# Patient Record
Sex: Male | Born: 1945 | Race: White | Hispanic: No | Marital: Married | Smoking: Former smoker
Health system: Southern US, Community
[De-identification: ages and names within clinical notes are randomized; demographics above are authoritative.]

## PROBLEM LIST (undated history)

## (undated) DIAGNOSIS — G4733 Obstructive sleep apnea (adult) (pediatric): Secondary | ICD-10-CM

## (undated) DIAGNOSIS — R011 Cardiac murmur, unspecified: Secondary | ICD-10-CM

## (undated) DIAGNOSIS — E7800 Pure hypercholesterolemia, unspecified: Secondary | ICD-10-CM

## (undated) DIAGNOSIS — K5790 Diverticulosis of intestine, part unspecified, without perforation or abscess without bleeding: Secondary | ICD-10-CM

## (undated) DIAGNOSIS — C61 Malignant neoplasm of prostate: Secondary | ICD-10-CM

## (undated) HISTORY — PX: TONSILLECTOMY: SUR1361

## (undated) HISTORY — PX: OTHER SURGICAL HISTORY: SHX169

---

## 2004-01-27 DIAGNOSIS — E7800 Pure hypercholesterolemia, unspecified: Secondary | ICD-10-CM | POA: Insufficient documentation

## 2005-02-05 ENCOUNTER — Ambulatory Visit: Payer: Self-pay | Admitting: Cardiology

## 2011-05-13 DIAGNOSIS — H9190 Unspecified hearing loss, unspecified ear: Secondary | ICD-10-CM | POA: Insufficient documentation

## 2011-05-13 DIAGNOSIS — H9319 Tinnitus, unspecified ear: Secondary | ICD-10-CM | POA: Insufficient documentation

## 2012-06-06 DIAGNOSIS — H6120 Impacted cerumen, unspecified ear: Secondary | ICD-10-CM | POA: Insufficient documentation

## 2014-11-25 DIAGNOSIS — Z79899 Other long term (current) drug therapy: Secondary | ICD-10-CM | POA: Insufficient documentation

## 2016-11-09 DIAGNOSIS — N528 Other male erectile dysfunction: Secondary | ICD-10-CM | POA: Insufficient documentation

## 2018-02-20 ENCOUNTER — Other Ambulatory Visit: Payer: Self-pay | Admitting: Urology

## 2018-02-20 DIAGNOSIS — C61 Malignant neoplasm of prostate: Secondary | ICD-10-CM

## 2018-03-01 DIAGNOSIS — J309 Allergic rhinitis, unspecified: Secondary | ICD-10-CM | POA: Insufficient documentation

## 2018-03-07 ENCOUNTER — Ambulatory Visit
Admission: RE | Admit: 2018-03-07 | Discharge: 2018-03-07 | Disposition: A | Discharge status: Home / Self Care | Payer: Medicare Other | Source: Ambulatory Visit | Attending: Urology | Admitting: Urology

## 2018-03-07 DIAGNOSIS — C61 Malignant neoplasm of prostate: Secondary | ICD-10-CM

## 2018-03-07 MED ORDER — GADOBENATE DIMEGLUMINE 529 MG/ML IV SOLN
15.0000 mL | Freq: Once | INTRAVENOUS | Status: AC | PRN
  Administered 2018-03-07: 15 mL via INTRAVENOUS

## 2018-04-24 ENCOUNTER — Telehealth: Payer: Self-pay | Admitting: Medical Oncology

## 2018-04-24 ENCOUNTER — Encounter: Payer: Self-pay | Admitting: Medical Oncology

## 2018-04-24 NOTE — Telephone Encounter (Signed)
I called pt to introduce myself as the Prostate Nurse Navigator and the Coordinator of the Prostate [place].  1. I confirmed with the patient he is aware of his referral to the clinic 04/28/18 arriving at 8:00 a.m  2. I discussed the format of the clinic and the physicians he will be seeing that day.  3. I discussed where the clinic is located and how to contact me.  4. I confirmed his address and informed him I would be mailing a packet of information and forms to be completed. I asked him to bring them with him the day of his appointment. He states that mail delivery is not the best where he lives and asked if I would e-mail forms. Forms were sent.  He voiced understanding of the above. I asked him to call me if he has any questions or concerns regarding his appointments or the forms he needs to complete.

## 2018-04-26 ENCOUNTER — Encounter: Payer: Self-pay | Admitting: Radiation Oncology

## 2018-04-26 NOTE — Progress Notes (Signed)
GU Location of Tumor / Histology: prostatic adenocarcinoma  If Prostate Cancer, Gleason Score is (4 + 3) and PSA is (10.8) on 01/2018. Prostate volume: 37.3 grams on 04/10/2018.   Adam Mendoza originally presented to Dr. Alinda Money in July 2015 for further evaluation of an elevated PSA> Prostate biopsy in 2011 by Roxanne Gates was benign despite PSA of 5.94. The patient underwent another biopsy in 2015 with Dr. Alinda Money for a PSA of 8.98 which was also benign.   07/27/2017 PSA 9.79 01/26/2017 PSA 9.51 01/06/2017 PSA 13.25  07/28/2016 PSA 9.21 06/21/2016 PSA 12.14 01/20/2016 PSA 9.56 11/17/2015 PSA 11.77 06/19/2015 PSA 8.40   Biopsies of prostate (if applicable) revealed:    Past/Anticipated interventions by urology, if any: biopsy, biopsy, referral to [facility]  Past/Anticipated interventions by medical oncology, if any: no  Weight changes, if any: no  Bowel/Bladder complaints, if any:    Nausea/Vomiting, if any: no  Pain issues, if any:  no  SAFETY ISSUES:  Prior radiation? no  Pacemaker/ICD? no  Possible current pregnancy? no  Is the patient on methotrexate? no  Current Complaints / other details:  73 year old male. Married. Former smoker.

## 2018-04-27 ENCOUNTER — Telehealth: Payer: Self-pay | Admitting: Medical Oncology

## 2018-04-27 DIAGNOSIS — C61 Malignant neoplasm of prostate: Secondary | ICD-10-CM | POA: Insufficient documentation

## 2018-04-27 NOTE — Telephone Encounter (Signed)
Spoke with patient to confirm [facility] appointment 04/28/18 arriving at 8:00 am. We reviewed [place] parking, registration and reminded him to bring his completed medical forms. He voiced understanding.

## 2018-04-28 ENCOUNTER — Inpatient Hospital Stay: Payer: Medicare Other | Attending: Oncology | Admitting: Oncology

## 2018-04-28 ENCOUNTER — Encounter: Payer: Self-pay | Admitting: Radiation Oncology

## 2018-04-28 ENCOUNTER — Other Ambulatory Visit: Payer: Self-pay

## 2018-04-28 ENCOUNTER — Encounter: Payer: Self-pay | Admitting: Medical Oncology

## 2018-04-28 ENCOUNTER — Encounter: Payer: Self-pay | Admitting: General Practice

## 2018-04-28 ENCOUNTER — Ambulatory Visit
Admission: RE | Admit: 2018-04-28 | Discharge: 2018-04-28 | Disposition: A | Discharge status: Home / Self Care | Payer: Medicare Other | Source: Ambulatory Visit | Attending: Radiation Oncology | Admitting: Radiation Oncology

## 2018-04-28 DIAGNOSIS — Z79899 Other long term (current) drug therapy: Secondary | ICD-10-CM | POA: Diagnosis not present

## 2018-04-28 DIAGNOSIS — N402 Nodular prostate without lower urinary tract symptoms: Secondary | ICD-10-CM | POA: Diagnosis not present

## 2018-04-28 DIAGNOSIS — C61 Malignant neoplasm of prostate: Secondary | ICD-10-CM

## 2018-04-28 DIAGNOSIS — G4733 Obstructive sleep apnea (adult) (pediatric): Secondary | ICD-10-CM | POA: Diagnosis not present

## 2018-04-28 DIAGNOSIS — E7800 Pure hypercholesterolemia, unspecified: Secondary | ICD-10-CM | POA: Diagnosis not present

## 2018-04-28 DIAGNOSIS — Z8546 Personal history of malignant neoplasm of prostate: Secondary | ICD-10-CM | POA: Diagnosis not present

## 2018-04-28 DIAGNOSIS — Z87891 Personal history of nicotine dependence: Secondary | ICD-10-CM

## 2018-04-28 DIAGNOSIS — Z923 Personal history of irradiation: Secondary | ICD-10-CM | POA: Insufficient documentation

## 2018-04-28 HISTORY — DX: Diverticulosis of intestine, part unspecified, without perforation or abscess without bleeding: K57.90

## 2018-04-28 HISTORY — DX: Pure hypercholesterolemia, unspecified: E78.00

## 2018-04-28 HISTORY — DX: Malignant neoplasm of prostate: C61

## 2018-04-28 HISTORY — DX: Obstructive sleep apnea (adult) (pediatric): G47.33

## 2018-04-28 NOTE — Consult Note (Signed)
Multi-Disciplinary Clinic     04/28/2018   --------------------------------------------------------------------------------   Adam Mendoza  MRN: [id number]  PRIMARY CARE:  Adam Mendoza  DOB: 01-23-1946, 73 year old Male  REFERRING:  Adam Mendoza  SSN: -[id number]  PROVIDER:  Raynelle Bring, M.D.    LOCATION:  [facility] [phone]   --------------------------------------------------------------------------------   CC/HPI: CC: Prostate Cancer   PCP: Dr. Eber Mendoza  Location of consult: [facility] - Prostate Cancer Multidisciplinary Clinic   Mr. Adam Mendoza is a 73 year old gentleman with a PMH significant for sleep apnea and hyperlipidemia who has a long standing history of an elevated PSA. He had undergone his initial prostate biopsy in 2011 when his PSA was 5.94 and this was benign. He established care with me in 2015 and his PSA had increased to 8.98 with a free percentage of 11% prompting another TRUS biopsy, which also was benign. His PSA further increased to 10.8 recently and an MRI of the prostate was performed on 03/07/18 that demonstrated two separate PI-RADS 3 lesions at the right mid gland and posterior apex. He underwent an MR/US fusion biopsy on 04/10/18 and the targeted lesions were benign but 3 of the systematic biopsies were positive and he was diagnosed with Gleason 4+3=7 adenocarcinoma with a total of 3 out of 20 biopsy cores positive.   Family history: None.   Imaging studies:  MRI (03/07/18): No EPE, SVI, or LAD.   PMH: He has a history of sleep apnea and hyperlipidemia.  PSH: No abdominal surgeries.   TNM stage: cT1c N0 Mx  PSA: 10.8  Gleason score: 4+3=7  Biopsy (04/10/18): 3/20 cores positive  Left: L lateral mid (10%, 4+3=7, PNI), L mid (5%, 3+4=7), L lateral base (20%, 4+3=7, PNI)  Right: Benign  Targeted biopsies: Benign  Prostate volume: 37.3 cc   Nomogram  OC disease: 49%  EPE: 49%  SVI: 2%  LNI: 3%  PFS (5  year, 10 year): 74%, 59%   Urinary function: IPSS is 3.  Erectile function: SHIM score is 18.     ALLERGIES: Sudafed TABS    MEDICATIONS: Amoxicillin  Codeine-Guaifenesin  Flonase 50 mcg/actuation spray, suspension Nasal  Glucosamine Chondroitin  Icaps  Multivitamins tablet Oral  Omega-3 Krill Oil  Simvastatin 20 mg tablet Oral     GU PSH: Prostate Needle Biopsy - 04/10/2018      PSH Notes: Shoulder Arthroscopy With Rotator Cuff Repair, Tonsillectomy   NON-GU PSH: Remove Tonsils - 2015 Rotator cuff surgery - 2015 Surgical Pathology, Gross And Microscopic Examination For Prostate Needle - 04/10/2018    GU PMH: Elevated PSA, Elevated prostate specific antigen (PSA) - 2017      PMH Notes:   1) Elevated PSA: He initially presented to me in July 2015 for an evaluation of an elevated PSA. He had undergone a 10 core biopsy in 2011 by Dr. Roxanne Gates in [place] for a PSA of 5.94 that was benign. His PSA continued to increase and he underwent another biopsy under my care in July 2015 for a PSA of 8.98 (11% free) that was also benign.   2011: 10 core biopsy by Dr. Lerry Liner - benign (PSA 5.94)  Jul 2015: 12 core biopsy (PSA 8.98) - benign, Vol 41.1 cc     NON-GU PMH: Diverticulosis of large intestine without perforation or abscess without bleeding, Diverticular disease of colon - 2015 Obstructive sleep apnea (adult) (pediatric), OSA (obstructive sleep apnea) - 2015 Hypercholesterolemia  FAMILY HISTORY: Alzheimer's Disease - Father, Mother Death of family member - Father, Mother   SOCIAL HISTORY: Marital Status: Married Preferred Language: English; Ethnicity: Not Hispanic Or Latino; Race: White Current Smoking Status: Patient does not smoke anymore. Has not smoked since 01/19/1990.  Drinks 3 drinks per day. Types of alcohol consumed: Liquor, Wine. Light Drinker.  Drinks 3 caffeinated drinks per day.     Notes: Alcohol use, Married, Former smoker   REVIEW OF SYSTEMS:    GU  Review Male:   Patient denies frequent urination, hard to postpone urination, burning/ pain with urination, get up at night to urinate, leakage of urine, stream starts and stops, trouble starting your streams, and have to strain to urinate .  Gastrointestinal (Upper):   Patient denies nausea and vomiting.  Gastrointestinal (Lower):   Patient denies diarrhea and constipation.  Constitutional:   Patient denies fever, night sweats, weight loss, and fatigue.  Skin:   Patient denies skin rash/ lesion and itching.  Eyes:   Patient denies blurred vision and double vision.  Ears/ Nose/ Throat:   Patient denies sore throat and sinus problems.  Hematologic/Lymphatic:   Patient denies swollen glands and easy bruising.  Cardiovascular:   Patient denies leg swelling and chest pains.  Respiratory:   Patient denies cough and shortness of breath.  Endocrine:   Patient denies excessive thirst.  Musculoskeletal:   Patient denies back pain and joint pain.  Neurological:   Patient denies headaches and dizziness.  Psychologic:   Patient denies anxiety and depression.   VITAL SIGNS: None   MULTI-SYSTEM PHYSICAL EXAMINATION:    Constitutional: Well-nourished. No physical deformities. Normally developed. Good grooming.  Neck: Neck symmetrical, not swollen. Normal tracheal position.  Respiratory: No labored breathing, no use of accessory muscles. Clear bilaterally.  Cardiovascular: Normal temperature, normal extremity pulses, no swelling, no varicosities. Regular rate and rhythm.  Lymphatic: No enlargement of neck, axillae, groin.  Skin: No paleness, no jaundice, no cyanosis. No lesion, no ulcer, no rash.  Neurologic / Psychiatric: Oriented to time, oriented to place, oriented to person. No depression, no anxiety, no agitation.  Gastrointestinal: No mass, no tenderness, no rigidity, non obese abdomen.   Eyes: Normal conjunctivae. Normal eyelids.  Ears, Nose, Mouth, and Throat: Left ear no scars, no lesions, no  masses. Right ear no scars, no lesions, no masses. Nose no scars, no lesions, no masses. Normal hearing. Normal lips.  Musculoskeletal: Normal gait and station of head and neck.     PAST DATA REVIEWED:  Source Of History:  Patient  Lab Test Review:   PSA  Records Review:   Pathology Reports, Previous Patient Records   02/15/18 07/27/17 01/26/17 01/06/17 07/28/16 06/21/16 01/20/16 11/17/15  PSA  Total PSA 10.80 ng/mL 9.79 ng/mL 9.51 ng/mL 13.25 ng/dl 9.21 ng/dl 12.14 ng/dl 9.56  11.77 ng/dl  Free PSA  0.79 ng/mL 0.79 ng/mL  0.60 ng/dl  0.63    % Free PSA  8 % PSA 8 % PSA  7 %  7      PROCEDURES: None   ASSESSMENT:      ICD-10 Details  1 GU:   Prostate Cancer - C61    PLAN:           Document Letter(s):  Created for Patient: Clinical Summary         Notes:   1. Prostate cancer: I had a detailed discussion with Mr. Hoback and his wife today regarding his management/treatment options for prostate cancer. The patient was  counseled about the natural history of prostate cancer and the standard treatment options that are available for prostate cancer. It was explained to him how his age and life expectancy, clinical stage, Gleason score, and PSA affect his prognosis, the decision to proceed with additional staging studies, as well as how that information influences recommended treatment strategies. We discussed the roles for active surveillance, radiation therapy, surgical therapy, androgen deprivation, as well as ablative therapy options for the treatment of prostate cancer as appropriate to his individual cancer situation. We discussed the risks and benefits of these options with regard to their impact on cancer control and also in terms of potential adverse events, complications, and impact on quality of life particularly related to urinary and sexual function. The patient was encouraged to ask questions throughout the discussion today and all questions were answered to his stated  satisfaction. In addition, the patient was provided with and/or directed to appropriate resources and literature for further education about prostate cancer and treatment options. We discussed surgical therapy for prostate cancer including the different available surgical approaches. We discussed, in detail, the risks and expectations of surgery with regard to cancer control, urinary control, and erectile function as well as the expected postoperative recovery process. Additional risks of surgery including but not limited to bleeding, infection, hernia formation, nerve damage, lymphocele formation, bowel/rectal injury potentially necessitating colostomy, damage to the urinary tract resulting in urine leakage, urethral stricture, and the cardiopulmonary risks such as myocardial infarction, stroke, death, venothromboembolism, etc. were explained. The risk of open surgical conversion for robotic/laparoscopic prostatectomy was also discussed.   After discussion, Mr. Schmuck is strongly leaning toward surgical treatment for prostate cancer. He is scheduled to meet with both Dr. Tammi Klippel and Dr. Alen Blew later this morning to discuss his options. Assuming that he confirms his decision, he will be scheduled for a bilateral nerve-sparing robot assisted laparoscopic radical prostatectomy and bilateral pelvic lymphadenectomy.   Cc: Dr. Eber Mendoza  Dr. Tyler Pita  Dr. Zola Button         Next Appointment:      Next Appointment: 08/04/2018 09:00 AM    Appointment Type: Office Visit Established Patient    Location: [facility] [phone]    Provider: Raynelle Bring, M.D.    Reason for Visit: 6 mo OV/ Urinalysis

## 2018-04-28 NOTE — Progress Notes (Signed)
[place] Psychosocial Distress Screening Spiritual Care  Met with Adam Mendoza and his wife Adam Mendoza in Prostate Multidisciplinary Clinic to introduce [place] team/resources, reviewing distress screen per protocol.  The patient scored a 1 on the Psychosocial Distress Thermometer which indicates mild distress. Also assessed for distress and other psychosocial needs.   ONCBCN DISTRESS SCREENING 04/28/2018  Screening Type Initial Screening  Distress experienced in past week (1-10) 1  Physical Problem type Pain  Referral to support programs Yes   [name] reports little distress, in part because of long period of watchful waiting prior to dx. He cites God and his care plan as two key sources of support. His wife Adam Mendoza was slightly tearful during encounter; normalized feelings, including disparate/varied feelings in couples, and encouraged [place] as resource for pt and caregiver.   Follow up needed: No. Per couple, no other needs at this time, but they know to contact Team with any needs, questions, or concerns.   [place], [place], [facility] Pager [phone] Voicemail [phone]

## 2018-04-28 NOTE — Progress Notes (Signed)
Radiation Oncology         [phone] ________________________________  Multidisciplinary Prostate Cancer Clinic  Initial Radiation Oncology Consultation  Name: Adam Mendoza MRN: [id number]  Date: 04/28/2018  DOB: 03/27/1946  BM:WUXLK, Adam Dibbles, MD  Raynelle Bring, MD   REFERRING PHYSICIAN: Raynelle Bring, MD  DIAGNOSIS: 73 y.o. gentleman with stage T2 adenocarcinoma of the prostate with a Gleason's score of 4+3 and a PSA of 10.8    ICD-10-CM   1. Malignant neoplasm of prostate ([place]) [place] ILLNESS::Adam Mendoza is a 73 y.o. gentleman from [place], [place].  He is a well established patient of Dr. Alinda Mendoza, followed for a history of elevated PSA and prostate nodule on DRE with previous benign prostate biopsies in 2011 with Dr. Lerry Mendoza in [place], [place] when his PSA was 5.94 and in 2015 with Dr. Alinda Mendoza when his PSA had increased to 8.98.  He has continued in routine follow-up with Dr. Alinda Mendoza to monitor his PSA which had stabilized around 9 for the past several years with stable left apex firmness on DRE.  However, more recently, his PSA rose to 10.8 on 02/15/2018.  He then underwent prostate MRI on 03/07/2018 which demonstrated a PI-RADS 4 lesion at the posterior apex of the peripheral zone as well as a PI-RADS 3 lesion in the right mid gland and apical posterior lateral peripheral zone.  The patient proceeded to MRI fusion transrectal ultrasound with a total of 16 core biopsy samples obtained on 04/10/2018.  The prostate volume measured 37.3 cc.  Out of 16 core biopsies, 3 were positive with benign findings in both of the region of interest (ROI) samples.  The maximum Gleason score was 4+3, and this was seen in left base lateral and left mid lateral. Gleason 3+4 was also noted in left mid gland.  The patient reviewed the biopsy results with his urologist and he has kindly been referred today to the multidisciplinary prostate cancer clinic for presentation  of pathology and radiology studies in our conference for discussion of potential radiation treatment options and clinical evaluation.   PREVIOUS RADIATION THERAPY: No  PAST MEDICAL HISTORY:  has a past medical history of Diverticulosis, Hypercholesterolemia, Obstructive sleep apnea, and Prostate cancer ([place]).    PAST SURGICAL HISTORY: Past Surgical History:  Procedure Laterality Date  . shoulder arthroscopy with rotator cuff repair    . TONSILLECTOMY      FAMILY HISTORY: family history is not on file.  SOCIAL HISTORY:  reports that he quit smoking about 28 years ago. His smoking use included cigarettes and pipe. He quit after 25.00 years of use. He has never used smokeless tobacco. He reports current alcohol use of about 1.0 standard drinks of alcohol per week. He reports that he does not use drugs.  ALLERGIES: Patient has no known allergies.  MEDICATIONS:  Current Outpatient Medications  Medication Sig Dispense Refill  . fluticasone (FLONASE) 50 MCG/ACT nasal spray Place into the nose.    . Glucosamine-Chondroit-Vit C-Mn (GLUCOSAMINE CHONDROITIN COMPLX) CAPS 3 po q am    . Krill Oil 1000 MG CAPS Take by mouth.    . Multiple Vitamins-Minerals (ICAPS PO) Take by mouth.    . Multiple Vitamins-Minerals (MULTIVITAMIN PO) Take by mouth.    . Omega-3 Fatty Acids (FISH OIL) 1000 MG CAPS 2 by mouth daily    . simvastatin (ZOCOR) 20 MG tablet Take by mouth.    . tadalafil (ADCIRCA/CIALIS) 20 MG tablet Take by mouth.  No current facility-administered medications for this encounter.     REVIEW OF SYSTEMS:  On review of systems, the patient reports that he is doing well overall. On patient form, he reports left should bursitis, wears glasses, and hearing loss (wears hearing aid). He denies any chest pain, shortness of breath, cough, fevers, chills, night sweats, unintended weight changes. He denies any bowel disturbances, and denies abdominal pain, nausea or vomiting. He denies any new  musculoskeletal or joint aches or pains. His IPSS was 3, indicating mild urinary symptoms with ocasional post-void dribbling of urine. His SHIM was 18, indicating he has mild erectile dysfunction. A complete review of systems is obtained and is otherwise negative.   PHYSICAL EXAM:  Wt Readings from Last 3 Encounters:  04/28/18 167 lb (75.8 kg)   Temp Readings from Last 3 Encounters:  04/28/18 97.6 F (36.4 C)   BP Readings from Last 3 Encounters:  04/28/18 134/70   Pulse Readings from Last 3 Encounters:  04/28/18 62   Pain Assessment Pain Score: 0-No pain/10  In general this is a well appearing Caucasian gentleman in no acute distress. He is alert and oriented x4 and appropriate throughout the examination. HEENT reveals that the patient is normocephalic, atraumatic. EOMs are intact. PERRLA. Skin is intact without any evidence of gross lesions. Cardiovascular exam reveals a regular rate and rhythm, no clicks rubs or murmurs are auscultated. Chest is clear to auscultation bilaterally. Lymphatic assessment is performed and does not reveal any adenopathy in the cervical, supraclavicular, axillary, or inguinal chains. Abdomen has active bowel sounds in all quadrants and is intact. The abdomen is soft, non tender, non distended. Lower extremities are negative for pretibial pitting edema, deep calf tenderness, cyanosis or clubbing.  KPS = 100  100 - Normal; no complaints; no evidence of disease. 90   - Able to carry on normal activity; minor signs or symptoms of disease. 80   - Normal activity with effort; some signs or symptoms of disease. 67   - Cares for self; unable to carry on normal activity or to do active work. 60   - Requires occasional assistance, but is able to care for most of his personal needs. 50   - Requires considerable assistance and frequent medical care. 67   - Disabled; requires special care and assistance. 41   - Severely disabled; hospital admission is indicated although  death not imminent. 36   - Very sick; hospital admission necessary; active supportive treatment necessary. 10   - Moribund; fatal processes progressing rapidly. 0     - Dead  Karnofsky DA, Abelmann WH, Craver LS and Burchenal JH [phone]) The use of the nitrogen mustards in the palliative treatment of carcinoma: with particular reference to bronchogenic carcinoma Cancer 1 634-56   LABORATORY DATA:  No results found for: WBC, HGB, HCT, MCV, PLT No results found for: NA, K, CL, CO2 No results found for: ALT, AST, GGT, ALKPHOS, BILITOT   RADIOGRAPHY: No results found.    IMPRESSION/PLAN: 73 y.o. gentleman with Stage T2 adenocarcinoma of the prostate with a PSA of 10.8 and a Gleason score of 4+3.    We discussed the patient's workup and outlined the nature of prostate cancer in this setting. The patient's T stage, Gleason's score, and PSA put him into the unfavorable intermediate risk group. Accordingly, he is eligible for a variety of potential treatment options including brachytherapy, 5.5 weeks of external radiation which could be performed closer to his home in [place] or [place]  or prostatectomy. We discussed the available radiation techniques, and focused on the details and logistics and delivery. We discussed and outlined the risks, benefits, short and long-term effects associated with radiotherapy and compared and contrasted these with prostatectomy.  It appears that he would be an excellent candidate for brachytherapy. We discussed the role of SpaceOAR in reducing the rectal toxicity associated with radiotherapy.   The patient focused most of his questions and interest in robotic-assisted laparoscopic radical prostatectomy.  We discussed some of the potential advantages of surgery including surgical staging, the availability of salvage radiotherapy to the prostatic fossa, and the confidence associated with immediate biochemical response.  We discussed some of the potential proven  indications for postoperative radiotherapy including positive margins, extracapsular extension, and seminal vesicle involvement. We also talked about some of the other potential findings leading to a recommendation for radiotherapy including a non-zero postoperative PSA and positive lymph nodes.   At the end of the conversation the patient is interested in moving forward with prostatectomy.  We enjoyed meeting this very nice gentleman and his wife, and we look forward to following along with his progress.  We advised that we would be more than happy to continue to participate in his care if clinically indicated in the future.  He knows that he is welcome to call at anytime with any further questions or concerns.     Adam Johns, PA-C    Tyler Pita, MD  [facility] Direct Dial: [phone]  Fax: [phone] [email or web address]  Skype  [email or web address]  This document serves as a record of services personally performed by Tyler Pita, MD and Freeman Caldron, PA-C. It was created on their behalf by Wilburn Mylar, a trained medical scribe. The creation of this record is based on the scribe's personal observations and the provider's statements to them. This document has been checked and approved by the attending provider.

## 2018-04-28 NOTE — Progress Notes (Signed)
                               Care Plan Summary  Name: Adam Mendoza DOB: [birth year]   Your Medical Team:   Urologist -  Dr. Raynelle Bring, [facility]  Radiation Oncologist - Dr. Tyler Pita, [facility]   Medical Oncologist - Dr. Zola Button, [place]  Recommendations: 1) Robotic prostatectomy 2) Seed implant  3) Radiation  * These recommendations are based on information available as of today's consult.      Recommendations may change depending on the results of further tests or exams.  Next Steps: 1) Dr. Lynne Logan office will schedule surgery    When appointments need to be scheduled, you will be contacted by [facility] and/or [facility].  Patient was provided with business cards for all team members and a copy of "[place] Patient Safety Plan."  Questions?  Please do not hesitate to call Adam Rue, RN, BSN, OCN at [phone] any questions or concerns.  Adam Mendoza is your Oncology Nurse Navigator and is available to assist you while you're receiving your medical care at [facility].

## 2018-04-28 NOTE — Progress Notes (Signed)
Reason for the request:    Prostate cancer  HPI: I was asked by Dr. Alinda Money to evaluate Adam Mendoza for prostate cancer.  He is a 73 year old man currently of [place] where he lived the majority of his life.  He is a rather healthy man with history of hyperlipidemia that was noted to have an elevated PSA since 2015.  His PSA was around 8.9 at that time.  Most recently his PSA was up to 10.5 and underwent an MRI/ultrasound fusion valuation and a biopsy.  This was completed on April 10, 2018 which showed 3 cores with a Gleason score 4+3 equal 7 around the base.  His MRI did show a PI-RADS lesion 3 at the apex that was benign.  Based on these findings he was referred for evaluation of the prostate cancer multidisciplinary clinic.  He is asymptomatic from these findings including no hematuria, dysuria or urgency.  He does report some occasional frequency at nighttime.  He remains in excellent health and remains active.  He does not report any headaches, blurry vision, syncope or seizures. Does not report any fevers, chills or sweats.  Does not report any cough, wheezing or hemoptysis.  Does not report any chest pain, palpitation, orthopnea or leg edema.  Does not report any nausea, vomiting or abdominal pain.  Does not report any constipation or diarrhea.  Does not report any skeletal complaints.    Does not report frequency, urgency or hematuria.  Does not report any skin rashes or lesions. Does not report any heat or cold intolerance.  Does not report any lymphadenopathy or petechiae.  Does not report any anxiety or depression.  Remaining review of systems is negative.    Past Medical History:  Diagnosis Date  . Diverticulosis   . Hypercholesterolemia   . Obstructive sleep apnea   . Prostate cancer [facility])   :  Past Surgical History:  Procedure Laterality Date  . shoulder arthroscopy with rotator cuff repair    . TONSILLECTOMY    :   Current Outpatient Medications:  .   fluticasone (FLONASE) 50 MCG/ACT nasal spray, Place into the nose., Disp: , Rfl:  .  Glucosamine-Chondroit-Vit C-Mn (GLUCOSAMINE CHONDROITIN COMPLX) CAPS, 3 po q am, Disp: , Rfl:  .  Krill Oil 1000 MG CAPS, Take by mouth., Disp: , Rfl:  .  Multiple Vitamins-Minerals (ICAPS PO), Take by mouth., Disp: , Rfl:  .  Multiple Vitamins-Minerals (MULTIVITAMIN PO), Take by mouth., Disp: , Rfl:  .  Omega-3 Fatty Acids (FISH OIL) 1000 MG CAPS, 2 by mouth daily, Disp: , Rfl:  .  simvastatin (ZOCOR) 20 MG tablet, Take by mouth., Disp: , Rfl:  .  tadalafil (ADCIRCA/CIALIS) 20 MG tablet, Take by mouth., Disp: , Rfl: :  No Known Allergies:  Family History  Problem Relation Age of Onset  . Prostate cancer Neg Hx   . Cancer Neg Hx   :  Social History   Socioeconomic History  . Marital status: Married    Spouse name: Mearl Latin  . Number of children: 1  . Years of education: Not on file  . Highest education level: Not on file  Occupational History  . Not on file  Social Needs  . Financial resource strain: Not on file  . Food insecurity:    Worry: Not on file    Inability: Not on file  . Transportation needs:    Medical: Not on file    Non-medical: Not on file  Tobacco Use  . Smoking  status: Former Smoker    Years: 25.00    Types: Cigarettes, Pipe    Last attempt to quit: 11/17/1989    Years since quitting: 28.4  . Smokeless tobacco: Never Used  Substance and Sexual Activity  . Alcohol use: Yes    Alcohol/week: 1.0 standard drinks    Types: 1 Glasses of wine per week    Comment: Light drinker   . Drug use: Never  . Sexual activity: Yes  Lifestyle  . Physical activity:    Days per week: Not on file    Minutes per session: Not on file  . Stress: Not on file  Relationships  . Social connections:    Talks on phone: Not on file    Gets together: Not on file    Attends religious service: Not on file    Active member of club or organization: Not on file    Attends meetings of clubs or  organizations: Not on file    Relationship status: Not on file  . Intimate partner violence:    Fear of current or ex partner: Not on file    Emotionally abused: Not on file    Physically abused: Not on file    Forced sexual activity: Not on file  Other Topics Concern  . Not on file  Social History Narrative  . Not on file  :  Pertinent items are noted in HPI.  Exam: ECOG 0 General appearance: alert and cooperative appeared without distress. Head: atraumatic without any abnormalities. Eyes: conjunctivae/corneas clear. PERRL.  Sclera anicteric. Throat: lips, mucosa, and tongue normal; without oral thrush or ulcers. Resp: clear to auscultation bilaterally without rhonchi, wheezes or dullness to percussion. Cardio: regular rate and rhythm, S1, S2 normal, no murmur, click, rub or gallop GI: soft, non-tender; bowel sounds normal; no masses,  no organomegaly Skin: Skin color, texture, turgor normal. No rashes or lesions Lymph nodes: Cervical, supraclavicular, and axillary nodes normal. Neurologic: Grossly normal without any motor, sensory or deep tendon reflexes. Musculoskeletal: No joint deformity or effusion.    Assessment and Plan:    73 year old man with prostate cancer diagnosed in December 2019.  His PSA was 10.5 and found to have a Gleason score of 4+3 equal 7 in 2 cores and a Gleason score 3+4 equal 7 and third core.  His case was discussed today in the prostate cancer multidisciplinary clinic.  That included imaging review of his MRI and discussion with the reviewing radiologist.  His pathology report was also discussed with the reviewing pathologist.  Treatment options were discussed today with the patient which include primary surgical therapy versus radiation therapy and short-term androgen deprivation.  Complications associated with androgen deprivation therapy as well as other systemic options were also reviewed.  He is interested in proceeding primary surgical therapy and  has discussed this with Dr. Alinda Money and ready to proceed.  I see no role for any systemic therapy at this time however he understands if he developed recurrent disease systemic therapy to exist and would be reasonable option although they are not curative.  All his questions answered today to his satisfaction.  30  minutes was spent with the patient face-to-face today.  More than 50% of time was dedicated to reviewing imaging studies, pathology reports, discussing his case with other specialists and answering questions regarding plan of care.   Thank you for the referral.   A copy of this consult has been forwarded to the requesting physician.

## 2018-05-01 ENCOUNTER — Telehealth: Payer: Self-pay

## 2018-05-01 NOTE — Telephone Encounter (Signed)
Per 11/3 no los °

## 2018-05-04 ENCOUNTER — Telehealth: Payer: Self-pay | Admitting: Medical Oncology

## 2018-05-04 NOTE — Telephone Encounter (Signed)
Called Adam Mendoza to follow up [facility]. He left a message yesterday asking about surgery date for prostatectomy. He was unavailable so I spoke with his wife to let her know that Dr. Lynne Logan office is working to get surgery scheduled. I explained that we have to get insurance approval and that takes a few days. He is scheduled for pre-op PT 1/20 at [facility]. I will send Dr. Alinda Money a note to inform him patient would like to get date as soon as possible. She voiced understanding.

## 2018-05-05 ENCOUNTER — Other Ambulatory Visit: Payer: Self-pay | Admitting: Urology

## 2018-05-08 ENCOUNTER — Telehealth: Payer: Self-pay | Admitting: Medical Oncology

## 2018-05-08 NOTE — Telephone Encounter (Signed)
Patient called stating he received his surgical date of 2/20. He is asking if he will have to do any surgery pre-testing. He has some educational classed he has to attend in February for his job and is trying to schedule appointment. I informed he will have pre-op testing. I gave him the number and asked him to call in order to schedule a convenient time for him. He did attend his pre-op PT and states it went very well. I asked him to call me back if he has any trouble in reaching pre-op. He voiced understanding.

## 2018-05-31 NOTE — Patient Instructions (Addendum)
Adam Mendoza  05/31/2018   Your procedure is scheduled on: 06-08-18    Report to [facility] Main  Entrance    Report to Admitting at 9:15 AM    Call this number if you have problems the morning of surgery [phone]    Remember: Do not eat food or drink liquids :After Midnight. BRUSH YOUR TEETH MORNING OF SURGERY AND RINSE YOUR MOUTH OUT, NO CHEWING GUM CANDY OR MINTS.     Take these medicines the morning of surgery with A SIP OF WATER: None. You may bring and use your nasal spray                                 You may not have any metal on your body including hair pins and              piercings  Do not wear jewelry, cologne, lotions, powders or deodorant             Men may shave face and neck.   Do not bring valuables to the hospital. Adam Mendoza.  Contacts, dentures or bridgework may not be worn into surgery.  Leave suitcase in the car. After surgery it may be brought to your room.     Patients discharged the day of surgery will not be allowed to drive home. IF YOU ARE HAVING SURGERY AND GOING HOME THE SAME DAY, YOU MUST HAVE AN ADULT TO DRIVE YOU HOME AND BE WITH YOU FOR 24 HOURS. YOU MAY GO HOME BY TAXI OR UBER OR ORTHERWISE, BUT AN ADULT MUST ACCOMPANY YOU HOME AND STAY WITH YOU FOR 24 HOURS.   Special Instructions: Follow your prep, per your surgeon's instructions              Please read over the following fact sheets you were given: _____________________________________________________________________             [facility] - Preparing for Surgery Before surgery, you can play an important role.  Because skin is not sterile, your skin needs to be as free of germs as possible.  You can reduce the number of germs on your skin by washing with CHG (chlorahexidine gluconate) soap before surgery.  CHG is an antiseptic cleaner which kills germs and bonds with the skin to continue killing  germs even after washing. Please DO NOT use if you have an allergy to CHG or antibacterial soaps.  If your skin becomes reddened/irritated stop using the CHG and inform your nurse when you arrive at Short Stay. Do not shave (including legs and underarms) for at least 48 hours prior to the first CHG shower.  You may shave your face/neck. Please follow these instructions carefully:  1.  Shower with CHG Soap the night before surgery and the  morning of Surgery.  2.  If you choose to wash your hair, wash your hair first as usual with your  normal  shampoo.  3.  After you shampoo, rinse your hair and body thoroughly to remove the  shampoo.                           4.  Use CHG as you would  any other liquid soap.  You can apply chg directly  to the skin and wash                       Gently with a scrungie or clean washcloth.  5.  Apply the CHG Soap to your body ONLY FROM THE NECK DOWN.   Do not use on face/ open                           Wound or open sores. Avoid contact with eyes, ears mouth and genitals (private parts).                       Wash face,  Genitals (private parts) with your normal soap.             6.  Wash thoroughly, paying special attention to the area where your surgery  will be performed.  7.  Thoroughly rinse your body with warm water from the neck down.  8.  DO NOT shower/wash with your normal soap after using and rinsing off  the CHG Soap.                9.  Pat yourself dry with a clean towel.            10.  Wear clean pajamas.            11.  Place clean sheets on your bed the night of your first shower and do not  sleep with pets. Day of Surgery : Do not apply any lotions/deodorants the morning of surgery.  Please wear clean clothes to the hospital/surgery center.  FAILURE TO FOLLOW THESE INSTRUCTIONS MAY RESULT IN THE CANCELLATION OF YOUR SURGERY PATIENT SIGNATURE_________________________________  NURSE  SIGNATURE__________________________________  ________________________________________________________________________   Adam Mendoza  An incentive spirometer is a tool that can help keep your lungs clear and active. This tool measures how well you are filling your lungs with each breath. Taking long deep breaths may help reverse or decrease the chance of developing breathing (pulmonary) problems (especially infection) following:  A long period of time when you are unable to move or be active. BEFORE THE PROCEDURE   If the spirometer includes an indicator to show your best effort, your nurse or respiratory therapist will set it to a desired goal.  If possible, sit up straight or lean slightly forward. Try not to slouch.  Hold the incentive spirometer in an upright position. INSTRUCTIONS FOR USE  1. Sit on the edge of your bed if possible, or sit up as far as you can in bed or on a chair. 2. Hold the incentive spirometer in an upright position. 3. Breathe out normally. 4. Place the mouthpiece in your mouth and seal your lips tightly around it. 5. Breathe in slowly and as deeply as possible, raising the piston or the ball toward the top of the column. 6. Hold your breath for 3-5 seconds or for as long as possible. Allow the piston or ball to fall to the bottom of the column. 7. Remove the mouthpiece from your mouth and breathe out normally. 8. Rest for a few seconds and repeat Steps 1 through 7 at least 10 times every 1-2 hours when you are awake. Take your time and take a few normal breaths between deep breaths. 9. The spirometer may include an indicator to show your best effort. Use the indicator as  a goal to work toward during each repetition. 10. After each set of 10 deep breaths, practice coughing to be sure your lungs are clear. If you have an incision (the cut made at the time of surgery), support your incision when coughing by placing a pillow or rolled up towels firmly  against it. Once you are able to get out of bed, walk around indoors and cough well. You may stop using the incentive spirometer when instructed by your caregiver.  RISKS AND COMPLICATIONS  Take your time so you do not get dizzy or light-headed.  If you are in pain, you may need to take or ask for pain medication before doing incentive spirometry. It is harder to take a deep breath if you are having pain. AFTER USE  Rest and breathe slowly and easily.  It can be helpful to keep track of a log of your progress. Your caregiver can provide you with a simple table to help with this. If you are using the spirometer at home, follow these instructions: [place] IF:   You are having difficultly using the spirometer.  You have trouble using the spirometer as often as instructed.  Your pain medication is not giving enough relief while using the spirometer.  You develop fever of 100.5 F (38.1 C) or higher. SEEK IMMEDIATE MEDICAL CARE IF:   You cough up bloody sputum that had not been present before.  You develop fever of 102 F (38.9 C) or greater.  You develop worsening pain at or near the incision site. MAKE SURE YOU:   Understand these instructions.  Will watch your condition.  Will get help right away if you are not doing well or get worse. Document Released: 08/16/2006 Document Revised: 06/28/2011 Document Reviewed: 10/17/2006 Exit[employer] Patient Information 2014 Exit[employer], [place].   ________________________________________________________________________  WHAT IS A BLOOD TRANSFUSION? Blood Transfusion Information  A transfusion is the replacement of blood or some of its parts. Blood is made up of multiple cells which provide different functions.  Red blood cells carry oxygen and are used for blood loss replacement.  White blood cells fight against infection.  Platelets control bleeding.  Plasma helps clot blood.  Other blood products are available for  specialized needs, such as hemophilia or other clotting disorders. BEFORE THE TRANSFUSION  Who gives blood for transfusions?   Healthy volunteers who are fully evaluated to make sure their blood is safe. This is blood bank blood. Transfusion therapy is the safest it has ever been in the practice of medicine. Before blood is taken from a donor, a complete history is taken to make sure that person has no history of diseases nor engages in risky social behavior (examples are intravenous drug use or sexual activity with multiple partners). The donor's travel history is screened to minimize risk of transmitting infections, such as malaria. The donated blood is tested for signs of infectious diseases, such as HIV and hepatitis. The blood is then tested to be sure it is compatible with you in order to minimize the chance of a transfusion reaction. If you or a relative donates blood, this is often done in anticipation of surgery and is not appropriate for emergency situations. It takes many days to process the donated blood. RISKS AND COMPLICATIONS Although transfusion therapy is very safe and saves many lives, the main dangers of transfusion include:   Getting an infectious disease.  Developing a transfusion reaction. This is an allergic reaction to something in the blood you were given.  Every precaution is taken to prevent this. The decision to have a blood transfusion has been considered carefully by your caregiver before blood is given. Blood is not given unless the benefits outweigh the risks. AFTER THE TRANSFUSION  Right after receiving a blood transfusion, you will usually feel much better and more energetic. This is especially true if your red blood cells have gotten low (anemic). The transfusion raises the level of the red blood cells which carry oxygen, and this usually causes an energy increase.  The nurse administering the transfusion will monitor you carefully for complications. HOME CARE  INSTRUCTIONS  No special instructions are needed after a transfusion. You may find your energy is better. Speak with your caregiver about any limitations on activity for underlying diseases you may have. SEEK MEDICAL CARE IF:   Your condition is not improving after your transfusion.  You develop redness or irritation at the intravenous (IV) site. SEEK IMMEDIATE MEDICAL CARE IF:  Any of the following symptoms occur over the next 12 hours:  Shaking chills.  You have a temperature by mouth above 102 F (38.9 C), not controlled by medicine.  Chest, back, or muscle pain.  People around you feel you are not acting correctly or are confused.  Shortness of breath or difficulty breathing.  Dizziness and fainting.  You get a rash or develop hives.  You have a decrease in urine output.  Your urine turns a dark color or changes to pink, red, or brown. Any of the following symptoms occur over the next 10 days:  You have a temperature by mouth above 102 F (38.9 C), not controlled by medicine.  Shortness of breath.  Weakness after normal activity.  The white part of the eye turns yellow (jaundice).  You have a decrease in the amount of urine or are urinating less often.  Your urine turns a dark color or changes to pink, red, or brown. Document Released: 04/02/2000 Document Revised: 06/28/2011 Document Reviewed: 11/20/2007 [facility] Patient Information 2014 [place], [place].  _______________________________________________________________________

## 2018-06-01 ENCOUNTER — Encounter (HOSPITAL_COMMUNITY): Payer: Self-pay

## 2018-06-01 ENCOUNTER — Other Ambulatory Visit: Payer: Self-pay

## 2018-06-01 ENCOUNTER — Encounter (HOSPITAL_COMMUNITY)
Admission: RE | Admit: 2018-06-01 | Discharge: 2018-06-01 | Disposition: A | Discharge status: Home / Self Care | Payer: Medicare Other | Source: Ambulatory Visit | Attending: Urology | Admitting: Urology

## 2018-06-01 DIAGNOSIS — Z01818 Encounter for other preprocedural examination: Secondary | ICD-10-CM | POA: Diagnosis present

## 2018-06-01 HISTORY — DX: Cardiac murmur, unspecified: R01.1

## 2018-06-01 LAB — BASIC METABOLIC PANEL
Anion gap: 8 (ref 5–15)
BUN: 14 mg/dL (ref 8–23)
CO2: 24 mmol/L (ref 22–32)
CREATININE: 0.88 mg/dL (ref 0.61–1.24)
Calcium: 9.2 mg/dL (ref 8.9–10.3)
Chloride: 107 mmol/L (ref 98–111)
GFR calc Af Amer: >60 mL/min (ref >60)
GFR calc non Af Amer: >60 mL/min (ref >60)
Glucose, Bld: 84 mg/dL (ref 70–99)
Potassium: 4 mmol/L (ref 3.5–5.1)
Sodium: 139 mmol/L (ref 135–145)

## 2018-06-01 LAB — CBC
HCT: 42.2 % (ref 39.0–52.0)
Hemoglobin: 14.1 g/dL (ref 13.0–17.0)
MCH: 32.6 pg (ref 26.0–34.0)
MCHC: 33.4 g/dL (ref 30.0–36.0)
MCV: 97.5 fL (ref 80.0–100.0)
PLATELETS: 259 10*3/uL (ref 150–400)
RBC: 4.33 MIL/uL (ref 4.22–5.81)
RDW: 12.5 % (ref 11.5–15.5)
WBC: 7 10*3/uL (ref 4.0–10.5)
nRBC: 0 % (ref 0.0–0.2)

## 2018-06-02 LAB — ABO/RH: ABO/RH(D): O POS

## 2018-06-07 NOTE — H&P (Signed)
CC/HPI: CC: Prostate Cancer    Adam Mendoza is a 73 year old gentleman with a PMH significant for sleep apnea and hyperlipidemia who has a long standing history of an elevated PSA. He had undergone his initial prostate biopsy in 2011 when his PSA was 5.94 and this was benign. He established care with me in 2015 and his PSA had increased to 8.98 with a free percentage of 11% prompting another TRUS biopsy, which also was benign. His PSA further increased to 10.8 recently and an MRI of the prostate was performed on 03/07/18 that demonstrated two separate PI-RADS 3 lesions at the right mid gland and posterior apex. He underwent an MR/US fusion biopsy on 04/10/18 and the targeted lesions were benign but 3 of the systematic biopsies were positive and he was diagnosed with Gleason 4+3=7 adenocarcinoma with a total of 3 out of 20 biopsy cores positive.   Family history: None.   Imaging studies:  MRI (03/07/18): No EPE, SVI, or LAD.   PMH: He has a history of sleep apnea and hyperlipidemia.  PSH: No abdominal surgeries.   TNM stage: cT1c N0 Mx  PSA: 10.8  Gleason score: 4+3=7  Biopsy (04/10/18): 3/20 cores positive  Left: L lateral mid (10%, 4+3=7, PNI), L mid (5%, 3+4=7), L lateral base (20%, 4+3=7, PNI)  Right: Benign  Targeted biopsies: Benign  Prostate volume: 37.3 cc   Nomogram  OC disease: 49%  EPE: 49%  SVI: 2%  LNI: 3%  PFS (5 year, 10 year): 74%, 59%   Urinary function: IPSS is 3.  Erectile function: SHIM score is 18.     ALLERGIES: Sudafed TABS    MEDICATIONS: Amoxicillin  Codeine-Guaifenesin  Flonase 50 mcg/actuation spray, suspension Nasal  Glucosamine Chondroitin  Icaps  Multivitamins tablet Oral  Omega-3 Krill Oil  Simvastatin 20 mg tablet Oral     GU PSH: Prostate Needle Biopsy - 04/10/2018      PSH Notes: Shoulder Arthroscopy With Rotator Cuff Repair, Tonsillectomy   NON-GU PSH: Remove Tonsils - 2015 Rotator cuff surgery - 2015 Surgical Pathology, Gross  And Microscopic Examination For Prostate Needle - 04/10/2018    GU PMH: Elevated PSA, Elevated prostate specific antigen (PSA) - 2017      PMH Notes:   1) Elevated PSA: He initially presented to me in July 2015 for an evaluation of an elevated PSA. He had undergone a 10 core biopsy in 2011 by Dr. Roxanne Gates in [place] for a PSA of 5.94 that was benign. His PSA continued to increase and he underwent another biopsy under my care in July 2015 for a PSA of 8.98 (11% free) that was also benign.   2011: 10 core biopsy by Dr. Lerry Liner - benign (PSA 5.94)  Jul 2015: 12 core biopsy (PSA 8.98) - benign, Vol 41.1 cc     NON-GU PMH: Diverticulosis of large intestine without perforation or abscess without bleeding, Diverticular disease of colon - 2015 Obstructive sleep apnea (adult) (pediatric), OSA (obstructive sleep apnea) - 2015 Hypercholesterolemia    FAMILY HISTORY: Alzheimer's Disease - Father, Mother Death of family member - Father, Mother   SOCIAL HISTORY: Marital Status: Married Preferred Language: English; Ethnicity: Not Hispanic Or Latino; Race: White Current Smoking Status: Patient does not smoke anymore. Has not smoked since 01/19/1990.  Drinks 3 drinks per day. Types of alcohol consumed: Liquor, Wine. Light Drinker.  Drinks 3 caffeinated drinks per day.     Notes: Alcohol use, Married, Former smoker   REVIEW OF SYSTEMS:  GU Review Male:   Patient denies frequent urination, hard to postpone urination, burning/ pain with urination, get up at night to urinate, leakage of urine, stream starts and stops, trouble starting your streams, and have to strain to urinate .  Gastrointestinal (Upper):   Patient denies nausea and vomiting.  Gastrointestinal (Lower):   Patient denies diarrhea and constipation.  Constitutional:   Patient denies fever, night sweats, weight loss, and fatigue.  Skin:   Patient denies skin rash/ lesion and itching.  Eyes:   Patient denies blurred vision and double  vision.  Ears/ Nose/ Throat:   Patient denies sore throat and sinus problems.  Hematologic/Lymphatic:   Patient denies swollen glands and easy bruising.  Cardiovascular:   Patient denies leg swelling and chest pains.  Respiratory:   Patient denies cough and shortness of breath.  Endocrine:   Patient denies excessive thirst.  Musculoskeletal:   Patient denies back pain and joint pain.  Neurological:   Patient denies headaches and dizziness.  Psychologic:   Patient denies anxiety and depression.     MULTI-SYSTEM PHYSICAL EXAMINATION:    Constitutional: Well-nourished. No physical deformities. Normally developed. Good grooming.  Neck: Neck symmetrical, not swollen. Normal tracheal position.  Respiratory: No labored breathing, no use of accessory muscles. Clear bilaterally.  Cardiovascular: Normal temperature, normal extremity pulses, no swelling, no varicosities. Regular rate and rhythm.  Lymphatic: No enlargement of neck, axillae, groin.  Skin: No paleness, no jaundice, no cyanosis. No lesion, no ulcer, no rash.  Neurologic / Psychiatric: Oriented to time, oriented to place, oriented to person. No depression, no anxiety, no agitation.  Gastrointestinal: No mass, no tenderness, no rigidity, non obese abdomen.   Eyes: Normal conjunctivae. Normal eyelids.  Ears, Nose, Mouth, and Throat: Left ear no scars, no lesions, no masses. Right ear no scars, no lesions, no masses. Nose no scars, no lesions, no masses. Normal hearing. Normal lips.  Musculoskeletal: Normal gait and station of head and neck.     ASSESSMENT:      ICD-10 Details  1 GU:   Prostate Cancer - C61    PLAN:           1. Prostate cancer: He has elected to proceed with surgical therapy and he will be scheduled for a bilateral nerve-sparing robot assisted laparoscopic radical prostatectomy and bilateral pelvic lymphadenectomy.

## 2018-06-08 ENCOUNTER — Other Ambulatory Visit: Payer: Self-pay

## 2018-06-08 ENCOUNTER — Encounter (HOSPITAL_COMMUNITY): Payer: Self-pay | Admitting: Certified Registered Nurse Anesthetist

## 2018-06-08 ENCOUNTER — Ambulatory Visit (HOSPITAL_COMMUNITY): Payer: Medicare Other | Admitting: Physician Assistant

## 2018-06-08 ENCOUNTER — Encounter (HOSPITAL_COMMUNITY)
Admission: RE | Disposition: A | Discharge status: Home / Self Care | Payer: Self-pay | Source: Home / Self Care | Attending: Urology

## 2018-06-08 ENCOUNTER — Observation Stay (HOSPITAL_COMMUNITY)
Admission: RE | Admit: 2018-06-08 | Discharge: 2018-06-09 | Disposition: A | Discharge status: Home / Self Care | Payer: Medicare Other | Attending: Urology | Admitting: Urology

## 2018-06-08 ENCOUNTER — Ambulatory Visit (HOSPITAL_COMMUNITY): Payer: Medicare Other | Admitting: Anesthesiology

## 2018-06-08 DIAGNOSIS — Z79899 Other long term (current) drug therapy: Secondary | ICD-10-CM | POA: Diagnosis not present

## 2018-06-08 DIAGNOSIS — C61 Malignant neoplasm of prostate: Secondary | ICD-10-CM | POA: Diagnosis present

## 2018-06-08 DIAGNOSIS — Z87891 Personal history of nicotine dependence: Secondary | ICD-10-CM | POA: Diagnosis not present

## 2018-06-08 DIAGNOSIS — Z7951 Long term (current) use of inhaled steroids: Secondary | ICD-10-CM | POA: Diagnosis not present

## 2018-06-08 DIAGNOSIS — E7800 Pure hypercholesterolemia, unspecified: Secondary | ICD-10-CM | POA: Insufficient documentation

## 2018-06-08 DIAGNOSIS — E785 Hyperlipidemia, unspecified: Secondary | ICD-10-CM | POA: Diagnosis not present

## 2018-06-08 DIAGNOSIS — G4733 Obstructive sleep apnea (adult) (pediatric): Secondary | ICD-10-CM | POA: Diagnosis not present

## 2018-06-08 HISTORY — PX: ROBOT ASSISTED LAPAROSCOPIC RADICAL PROSTATECTOMY: SHX5141

## 2018-06-08 HISTORY — PX: LYMPHADENECTOMY: SHX5960

## 2018-06-08 LAB — HEMOGLOBIN AND HEMATOCRIT, BLOOD
HCT: 42.6 % (ref 39.0–52.0)
Hemoglobin: 13.8 g/dL (ref 13.0–17.0)

## 2018-06-08 LAB — TYPE AND SCREEN
ABO/RH(D): O POS
Antibody Screen: NEGATIVE

## 2018-06-08 SURGERY — XI ROBOTIC ASSISTED LAPAROSCOPIC RADICAL PROSTATECTOMY LEVEL 2
Anesthesia: General

## 2018-06-08 MED ORDER — MEPERIDINE HCL 50 MG/ML IJ SOLN
INTRAMUSCULAR | Status: AC
  Filled 2018-06-08: qty 1

## 2018-06-08 MED ORDER — CEFAZOLIN SODIUM-DEXTROSE 1-4 GM/50ML-% IV SOLN
1.0000 g | Freq: Three times a day (TID) | INTRAVENOUS | Status: AC
  Administered 2018-06-08 – 2018-06-09 (×2): 1 g via INTRAVENOUS
  Filled 2018-06-08 (×2): qty 50

## 2018-06-08 MED ORDER — KETOROLAC TROMETHAMINE 15 MG/ML IJ SOLN
15.0000 mg | Freq: Four times a day (QID) | INTRAMUSCULAR | Status: DC
  Administered 2018-06-08 – 2018-06-09 (×4): 15 mg via INTRAVENOUS
  Filled 2018-06-08 (×3): qty 1

## 2018-06-08 MED ORDER — ROCURONIUM BROMIDE 100 MG/10ML IV SOLN
INTRAVENOUS | Status: DC | PRN
  Administered 2018-06-08: 50 mg via INTRAVENOUS
  Administered 2018-06-08: 10 mg via INTRAVENOUS
  Administered 2018-06-08: 20 mg via INTRAVENOUS

## 2018-06-08 MED ORDER — LACTATED RINGERS IV SOLN
INTRAVENOUS | Status: DC
  Administered 2018-06-08: 09:00:00 via INTRAVENOUS

## 2018-06-08 MED ORDER — BELLADONNA ALKALOIDS-OPIUM 16.2-60 MG RE SUPP
1.0000 | Freq: Four times a day (QID) | RECTAL | Status: DC | PRN
  Administered 2018-06-08: 1 via RECTAL

## 2018-06-08 MED ORDER — SODIUM CHLORIDE 0.9 % IV BOLUS
1000.0000 mL | Freq: Once | INTRAVENOUS | Status: AC
  Administered 2018-06-08: 1000 mL via INTRAVENOUS

## 2018-06-08 MED ORDER — DIPHENHYDRAMINE HCL 12.5 MG/5ML PO ELIX: 12.5000 mg | ORAL_SOLUTION | Freq: Four times a day (QID) | ORAL | Status: DC | PRN

## 2018-06-08 MED ORDER — PROPOFOL 10 MG/ML IV BOLUS
INTRAVENOUS | Status: DC | PRN
  Administered 2018-06-08: 200 mg via INTRAVENOUS
  Administered 2018-06-08: 50 mg via INTRAVENOUS

## 2018-06-08 MED ORDER — SODIUM CHLORIDE 0.9 % IR SOLN
Status: DC | PRN
  Administered 2018-06-08: 1000 mL

## 2018-06-08 MED ORDER — FENTANYL CITRATE (PF) 100 MCG/2ML IJ SOLN
INTRAMUSCULAR | Status: AC
  Administered 2018-06-08: 50 ug via INTRAVENOUS
  Filled 2018-06-08: qty 2

## 2018-06-08 MED ORDER — EPHEDRINE SULFATE 50 MG/ML IJ SOLN
INTRAMUSCULAR | Status: DC | PRN
  Administered 2018-06-08 (×2): 5 mg via INTRAVENOUS

## 2018-06-08 MED ORDER — LACTATED RINGERS IV SOLN
INTRAVENOUS | Status: DC | PRN
  Administered 2018-06-08 (×2): via INTRAVENOUS

## 2018-06-08 MED ORDER — KETOROLAC TROMETHAMINE 15 MG/ML IJ SOLN
INTRAMUSCULAR | Status: AC
  Filled 2018-06-08: qty 1

## 2018-06-08 MED ORDER — LACTATED RINGERS IV SOLN
INTRAVENOUS | Status: DC | PRN
  Administered 2018-06-08: 11:00:00

## 2018-06-08 MED ORDER — DOCUSATE SODIUM 100 MG PO CAPS
100.0000 mg | ORAL_CAPSULE | Freq: Two times a day (BID) | ORAL | Status: DC
  Administered 2018-06-08 – 2018-06-09 (×2): 100 mg via ORAL
  Filled 2018-06-08 (×2): qty 1

## 2018-06-08 MED ORDER — SIMVASTATIN 20 MG PO TABS
20.0000 mg | ORAL_TABLET | Freq: Every day | ORAL | Status: DC
  Administered 2018-06-09: 20 mg via ORAL
  Filled 2018-06-08 (×2): qty 1

## 2018-06-08 MED ORDER — DEXAMETHASONE SODIUM PHOSPHATE 10 MG/ML IJ SOLN
INTRAMUSCULAR | Status: DC | PRN
  Administered 2018-06-08: 10 mg via INTRAVENOUS

## 2018-06-08 MED ORDER — ONDANSETRON HCL 4 MG/2ML IJ SOLN
INTRAMUSCULAR | Status: DC | PRN
  Administered 2018-06-08: 4 mg via INTRAVENOUS

## 2018-06-08 MED ORDER — HEPARIN SODIUM (PORCINE) 1000 UNIT/ML IJ SOLN
INTRAMUSCULAR | Status: AC
  Filled 2018-06-08: qty 1

## 2018-06-08 MED ORDER — FENTANYL CITRATE (PF) 250 MCG/5ML IJ SOLN
INTRAMUSCULAR | Status: AC
  Filled 2018-06-08: qty 5

## 2018-06-08 MED ORDER — BELLADONNA ALKALOIDS-OPIUM 16.2-60 MG RE SUPP
RECTAL | Status: AC
  Filled 2018-06-08: qty 1

## 2018-06-08 MED ORDER — OXYCODONE HCL 5 MG/5ML PO SOLN: 5.0000 mg | Freq: Once | ORAL | Status: DC | PRN

## 2018-06-08 MED ORDER — LIDOCAINE HCL (CARDIAC) PF 100 MG/5ML IV SOSY
PREFILLED_SYRINGE | INTRAVENOUS | Status: DC | PRN
  Administered 2018-06-08: 100 mg via INTRAVENOUS

## 2018-06-08 MED ORDER — BUPIVACAINE-EPINEPHRINE (PF) 0.5% -1:200000 IJ SOLN
INTRAMUSCULAR | Status: AC
  Filled 2018-06-08: qty 30

## 2018-06-08 MED ORDER — CEFAZOLIN SODIUM-DEXTROSE 2-4 GM/100ML-% IV SOLN
2.0000 g | Freq: Once | INTRAVENOUS | Status: AC
  Administered 2018-06-08: 2 g via INTRAVENOUS
  Filled 2018-06-08: qty 100

## 2018-06-08 MED ORDER — MORPHINE SULFATE (PF) 2 MG/ML IV SOLN: 2.0000 mg | INTRAVENOUS | Status: DC | PRN

## 2018-06-08 MED ORDER — MIDAZOLAM HCL 5 MG/5ML IJ SOLN
INTRAMUSCULAR | Status: DC | PRN
  Administered 2018-06-08 (×2): 1 mg via INTRAVENOUS

## 2018-06-08 MED ORDER — FENTANYL CITRATE (PF) 100 MCG/2ML IJ SOLN
25.0000 ug | INTRAMUSCULAR | Status: DC | PRN
  Administered 2018-06-08 (×2): 50 ug via INTRAVENOUS

## 2018-06-08 MED ORDER — DIPHENHYDRAMINE HCL 50 MG/ML IJ SOLN: 12.5000 mg | Freq: Four times a day (QID) | INTRAMUSCULAR | Status: DC | PRN

## 2018-06-08 MED ORDER — ONDANSETRON HCL 4 MG/2ML IJ SOLN: 4.0000 mg | Freq: Once | INTRAMUSCULAR | Status: DC | PRN

## 2018-06-08 MED ORDER — ACETAMINOPHEN 325 MG PO TABS: 325.0000 mg | ORAL_TABLET | ORAL | Status: DC | PRN

## 2018-06-08 MED ORDER — SUGAMMADEX SODIUM 500 MG/5ML IV SOLN
INTRAVENOUS | Status: DC | PRN
  Administered 2018-06-08: 200 mg via INTRAVENOUS

## 2018-06-08 MED ORDER — MEPERIDINE HCL 50 MG/ML IJ SOLN
6.2500 mg | INTRAMUSCULAR | Status: DC | PRN
  Administered 2018-06-08 (×2): 12.5 mg via INTRAVENOUS

## 2018-06-08 MED ORDER — ACETAMINOPHEN 325 MG PO TABS: 650.0000 mg | ORAL_TABLET | ORAL | Status: DC | PRN

## 2018-06-08 MED ORDER — GLYCOPYRROLATE 0.2 MG/ML IJ SOLN
INTRAMUSCULAR | Status: DC | PRN
  Administered 2018-06-08: 0.1 mg via INTRAVENOUS

## 2018-06-08 MED ORDER — ACETAMINOPHEN 160 MG/5ML PO SOLN: 325.0000 mg | ORAL | Status: DC | PRN

## 2018-06-08 MED ORDER — OXYCODONE HCL 5 MG PO TABS: 5.0000 mg | ORAL_TABLET | Freq: Once | ORAL | Status: DC | PRN

## 2018-06-08 MED ORDER — MIDAZOLAM HCL 2 MG/2ML IJ SOLN
INTRAMUSCULAR | Status: AC
  Filled 2018-06-08: qty 2

## 2018-06-08 MED ORDER — MAGNESIUM CITRATE PO SOLN: 1.0000 | Freq: Once | ORAL | Status: DC

## 2018-06-08 MED ORDER — BACITRACIN-NEOMYCIN-POLYMYXIN 400-5-5000 EX OINT: 1.0000 "application " | TOPICAL_OINTMENT | Freq: Three times a day (TID) | CUTANEOUS | Status: DC | PRN

## 2018-06-08 MED ORDER — FENTANYL CITRATE (PF) 100 MCG/2ML IJ SOLN
INTRAMUSCULAR | Status: DC | PRN
  Administered 2018-06-08 (×5): 50 ug via INTRAVENOUS

## 2018-06-08 MED ORDER — PROPOFOL 10 MG/ML IV BOLUS
INTRAVENOUS | Status: AC
  Filled 2018-06-08: qty 20

## 2018-06-08 MED ORDER — BUPIVACAINE-EPINEPHRINE (PF) 0.5% -1:200000 IJ SOLN
INTRAMUSCULAR | Status: DC | PRN
  Administered 2018-06-08: 30 mL

## 2018-06-08 MED ORDER — FLUTICASONE PROPIONATE 50 MCG/ACT NA SUSP
1.0000 | Freq: Every day | NASAL | Status: DC
  Administered 2018-06-09: 1 via NASAL
  Filled 2018-06-08: qty 16

## 2018-06-08 MED ORDER — FLEET ENEMA 7-19 GM/118ML RE ENEM: 1.0000 | ENEMA | Freq: Once | RECTAL | Status: DC

## 2018-06-08 MED ORDER — KCL IN DEXTROSE-NACL 20-5-0.45 MEQ/L-%-% IV SOLN
INTRAVENOUS | Status: DC
  Administered 2018-06-08 – 2018-06-09 (×3): via INTRAVENOUS
  Filled 2018-06-08 (×3): qty 1000

## 2018-06-08 MED ORDER — SULFAMETHOXAZOLE-TRIMETHOPRIM 800-160 MG PO TABS: 1.0000 | ORAL_TABLET | Freq: Two times a day (BID) | ORAL | 0 refills | Status: AC

## 2018-06-08 MED ORDER — TRAMADOL HCL 50 MG PO TABS: 50.0000 mg | ORAL_TABLET | Freq: Four times a day (QID) | ORAL | 0 refills | Status: AC | PRN

## 2018-06-08 MED ORDER — ZOLPIDEM TARTRATE 5 MG PO TABS: 5.0000 mg | ORAL_TABLET | Freq: Every evening | ORAL | Status: DC | PRN

## 2018-06-08 MED ORDER — ONDANSETRON HCL 4 MG/2ML IJ SOLN: 4.0000 mg | INTRAMUSCULAR | Status: DC | PRN

## 2018-06-08 SURGICAL SUPPLY — 56 items
ADH SKN CLS APL DERMABOND .7 (GAUZE/BANDAGES/DRESSINGS) ×1
APPLICATOR COTTON TIP 6 STRL (MISCELLANEOUS) ×2 IMPLANT
APPLICATOR COTTON TIP 6IN STRL (MISCELLANEOUS) ×4
CATH FOLEY 2WAY SLVR 18FR 30CC (CATHETERS) ×4 IMPLANT
CATH ROBINSON RED A/P 16FR (CATHETERS) ×4 IMPLANT
CATH ROBINSON RED A/P 8FR (CATHETERS) ×4 IMPLANT
CATH TIEMANN FOLEY 18FR 5CC (CATHETERS) ×4 IMPLANT
CHLORAPREP W/TINT 26ML (MISCELLANEOUS) ×4 IMPLANT
CLIP VESOLOCK LG 6/CT PURPLE (CLIP) ×16 IMPLANT
COVER SURGICAL LIGHT HANDLE (MISCELLANEOUS) ×4 IMPLANT
COVER TIP SHEARS 8 DVNC (MISCELLANEOUS) ×2 IMPLANT
COVER TIP SHEARS 8MM DA VINCI (MISCELLANEOUS) ×2
COVER WAND RF STERILE (DRAPES) IMPLANT
CUTTER ECHEON FLEX ENDO 45 340 (ENDOMECHANICALS) ×4 IMPLANT
DECANTER SPIKE VIAL GLASS SM (MISCELLANEOUS) ×4 IMPLANT
DERMABOND ADVANCED (GAUZE/BANDAGES/DRESSINGS) ×2
DERMABOND ADVANCED .7 DNX12 (GAUZE/BANDAGES/DRESSINGS) ×2 IMPLANT
DRAPE ARM DVNC X/XI (DISPOSABLE) ×8 IMPLANT
DRAPE COLUMN DVNC XI (DISPOSABLE) ×2 IMPLANT
DRAPE DA VINCI XI ARM (DISPOSABLE) ×8
DRAPE DA VINCI XI COLUMN (DISPOSABLE) ×2
DRAPE SURG IRRIG POUCH 19X23 (DRAPES) ×4 IMPLANT
DRSG TEGADERM 4X4.75 (GAUZE/BANDAGES/DRESSINGS) ×4 IMPLANT
ELECT PENCIL ROCKER SW 15FT (MISCELLANEOUS) ×4 IMPLANT
ELECT REM PT RETURN 15FT ADLT (MISCELLANEOUS) ×4 IMPLANT
GLOVE BIO SURGEON STRL SZ 6.5 (GLOVE) ×3 IMPLANT
GLOVE BIO SURGEONS STRL SZ 6.5 (GLOVE) ×1
GLOVE BIOGEL M STRL SZ7.5 (GLOVE) ×8 IMPLANT
GOWN STRL REUS W/TWL LRG LVL3 (GOWN DISPOSABLE) ×12 IMPLANT
HOLDER FOLEY CATH W/STRAP (MISCELLANEOUS) ×4 IMPLANT
IRRIG SUCT STRYKERFLOW 2 WTIP (MISCELLANEOUS) ×4
IRRIGATION SUCT STRKRFLW 2 WTP (MISCELLANEOUS) ×2 IMPLANT
IV LACTATED RINGERS 1000ML (IV SOLUTION) ×4 IMPLANT
NDL SAFETY ECLIPSE 18X1.5 (NEEDLE) ×2 IMPLANT
NEEDLE HYPO 18GX1.5 SHARP (NEEDLE) ×3
PACK ROBOT UROLOGY CUSTOM (CUSTOM PROCEDURE TRAY) ×4 IMPLANT
SEAL CANN UNIV 5-8 DVNC XI (MISCELLANEOUS) ×8 IMPLANT
SEAL XI 5MM-8MM UNIVERSAL (MISCELLANEOUS) ×8
SOLUTION ELECTROLUBE (MISCELLANEOUS) ×4 IMPLANT
STAPLE RELOAD 45 GRN (STAPLE) ×2 IMPLANT
STAPLE RELOAD 45MM GREEN (STAPLE) ×2
SUT ETHILON 3 0 PS 1 (SUTURE) ×4 IMPLANT
SUT MNCRL 3 0 RB1 (SUTURE) ×2 IMPLANT
SUT MNCRL 3 0 VIOLET RB1 (SUTURE) ×2 IMPLANT
SUT MNCRL AB 4-0 PS2 18 (SUTURE) ×8 IMPLANT
SUT MONOCRYL 3 0 RB1 (SUTURE) ×4
SUT VIC AB 0 CT1 27 (SUTURE) ×2
SUT VIC AB 0 CT1 27XBRD ANTBC (SUTURE) ×2 IMPLANT
SUT VIC AB 0 UR5 27 (SUTURE) ×4 IMPLANT
SUT VIC AB 2-0 SH 27 (SUTURE) ×3
SUT VIC AB 2-0 SH 27X BRD (SUTURE) ×2 IMPLANT
SUT VICRYL 0 UR6 27IN ABS (SUTURE) ×8 IMPLANT
SYR 27GX1/2 1ML LL SAFETY (SYRINGE) ×4 IMPLANT
TOWEL OR 17X26 10 PK STRL BLUE (TOWEL DISPOSABLE) ×4 IMPLANT
TOWEL OR NON WOVEN STRL DISP B (DISPOSABLE) ×4 IMPLANT
TUBING INSUFFLATION 10FT LAP (TUBING) ×4 IMPLANT

## 2018-06-08 NOTE — Progress Notes (Signed)
Patient ID: Adam Mendoza, male   DOB: Sep 01, 1945, 73 y.o.   MRN: [id number]  Post-op note  Subjective: The patient is doing well.  No complaints.  Objective: Vital signs in last 24 hours: Temp:  [97.8 F (36.6 C)-98.1 F (36.7 C)] 97.8 F (36.6 C) (02/20 1557) Pulse Rate:  [63-77] 77 (02/20 1557) Resp:  [14-18] 14 (02/20 1557) BP: (138-149)/(78-86) 149/86 (02/20 1557) SpO2:  [97 %-100 %] 98 % (02/20 1557)  Intake/Output from previous day: No intake/output data recorded. Intake/Output this shift: Total I/O In: 2575 [I.V.:1575; IV Piggyback:1000] Out: 200 [Urine:100; Drains:50; Blood:50]  Physical Exam:  General: Alert and oriented. Abdomen: Soft, Nondistended. Incisions: Clean and dry. GU: Urine clear.  Lab Results: Recent Labs    06/08/18 1425  HGB 13.8  HCT 42.6    Assessment/Plan: POD#0   1) Continue to monitor, ambulate, IS   Adam Mendoza. MD   LOS: 0 days   Adam Mendoza 06/08/2018, 4:03 PM

## 2018-06-08 NOTE — Anesthesia Procedure Notes (Signed)
Procedure Name: Intubation Date/Time: 06/08/2018 11:02 AM Performed by: Ofilia Neas, CRNA Pre-anesthesia Checklist: Patient identified, Emergency Drugs available, Suction available, Patient being monitored and Timeout performed Patient Re-evaluated:Patient Re-evaluated prior to induction Oxygen Delivery Method: Circle system utilized Preoxygenation: Pre-oxygenation with 100% oxygen Induction Type: IV induction and Cricoid Pressure applied Ventilation: Mask ventilation without difficulty and Oral airway inserted - appropriate to patient size Laryngoscope Size: Glidescope and 4 Grade View: Grade III Tube type: Oral Tube size: 7.5 mm Number of attempts: 2 Airway Equipment and Method: Video-laryngoscopy Placement Confirmation: ETT inserted through vocal cords under direct vision,  positive ETCO2 and breath sounds checked- equal and bilateral Secured at: 21 cm Tube secured with: Tape Dental Injury: Teeth and Oropharynx as per pre-operative assessment  Difficulty Due To: Difficulty was anticipated and Difficult Airway- due to anterior larynx Future Recommendations: Recommend- induction with short-acting agent, and alternative techniques readily available Comments: First DL Mac 4, unable to expose cords with head lift/cricoid. Anterior.  To glidescope. #4 cords visualized with head lift and cricoid pressure. No trauma.

## 2018-06-08 NOTE — Transfer of Care (Signed)
Immediate Anesthesia Transfer of Care Note  Patient: Adam Mendoza  Procedure(s) Performed: XI ROBOTIC ASSISTED LAPAROSCOPIC RADICAL PROSTATECTOMY LEVEL 2 (N/A ) LYMPHADENECTOMY, PELVIC (Bilateral )  Patient Location: PACU  Anesthesia Type:General  Level of Consciousness: awake, oriented, drowsy, patient cooperative and responds to stimulation  Airway & Oxygen Therapy: Patient Spontanous Breathing and Patient connected to face mask oxygen  Post-op Assessment: Report given to RN, Post -op Vital signs reviewed and stable and Patient moving all extremities  Post vital signs: Reviewed and stable  Last Vitals:  Vitals Value Taken Time  BP 133/86 06/08/2018  2:17 PM  Temp    Pulse 73 06/08/2018  2:20 PM  Resp 17 06/08/2018  2:20 PM  SpO2 100 % 06/08/2018  2:20 PM  Vitals shown include unvalidated device data.  Last Pain:  Vitals:   06/08/18 0912  TempSrc: Oral         Complications: No apparent anesthesia complications

## 2018-06-08 NOTE — Anesthesia Postprocedure Evaluation (Signed)
Anesthesia Post Note  Patient: Adam Mendoza  Procedure(s) Performed: XI ROBOTIC ASSISTED LAPAROSCOPIC RADICAL PROSTATECTOMY LEVEL 2 (N/A ) LYMPHADENECTOMY, PELVIC (Bilateral )     Patient location during evaluation: PACU Anesthesia Type: General Level of consciousness: awake and alert Pain management: pain level controlled Vital Signs Assessment: post-procedure vital signs reviewed and stable Respiratory status: spontaneous breathing, nonlabored ventilation, respiratory function stable and patient connected to nasal cannula oxygen Cardiovascular status: blood pressure returned to baseline and stable Postop Assessment: no apparent nausea or vomiting Anesthetic complications: no    Last Vitals:  Vitals:   06/08/18 1530 06/08/18 1557  BP:  (!) 149/86  Pulse:  77  Resp:  14  Temp: 36.7 C 36.6 C  SpO2:  98%    Last Pain:  Vitals:   06/08/18 1557  TempSrc: Oral  PainSc:                  Adam Mendoza

## 2018-06-08 NOTE — Progress Notes (Signed)
Post-op note  Subjective: The patient is doing well. Some bladder spasms post-op, recently given B&O suppository.  Objective: Vital signs in last 24 hours: Temp:  [98 F (36.7 C)-98.1 F (36.7 C)] 98 F (36.7 C) (02/20 1530) Pulse Rate:  [63-73] 73 (02/20 1445) Resp:  [16-18] 16 (02/20 1445) BP: (138-139)/(78-85) 139/85 (02/20 1445) SpO2:  [97 %-100 %] 100 % (02/20 1445)  Intake/Output from previous day: No intake/output data recorded. Intake/Output this shift: Total I/O In: 2575 [I.V.:1575; IV Piggyback:1000] Out: 200 [Urine:100; Drains:50; Blood:50]  Physical Exam:  General: Alert and oriented. Abdomen: Soft, Nondistended, JP with s/s output Incisions: Clean and dry. GU: foley catheter to drainage, draining thin watermelon color urine  Lab Results: Recent Labs    06/08/18 1425  HGB 13.8  HCT 42.6    Assessment/Plan: POD#0 s/p RALP. Doing well.  1) Continue pain control with PRN medication 2) Continue mIVF 3) Continue Foley catheter and JP drain s/p urologic procedure 4) Ambulate tonight 5) IS, SCDs   Adam Mendoza Rob Bunting, MD   LOS: 0 days   Nocole Zammit Rob Bunting 06/08/2018, 3:49 PM

## 2018-06-08 NOTE — Anesthesia Preprocedure Evaluation (Addendum)
Anesthesia Evaluation  Patient identified by MRN, date of birth, ID band Patient awake    Reviewed: Allergy & Precautions, H&P , NPO status , Patient's Chart, lab work & pertinent test results, reviewed documented beta blocker date and time   Airway Mallampati: II  TM Distance: >3 FB Neck ROM: full    Dental no notable dental hx. (+) Teeth Intact   Pulmonary former smoker,    Pulmonary exam normal breath sounds clear to auscultation       Cardiovascular Exercise Tolerance: Good negative cardio ROS   Rhythm:regular Rate:Normal     Neuro/Psych negative neurological ROS  negative psych ROS   GI/Hepatic negative GI ROS, Neg liver ROS,   Endo/Other  negative endocrine ROS  Renal/GU negative Renal ROS  negative genitourinary   Musculoskeletal   Abdominal   Peds  Hematology negative hematology ROS (+)   Anesthesia Other Findings   Reproductive/Obstetrics negative OB ROS                           Anesthesia Physical Anesthesia Plan  ASA: II  Anesthesia Plan: General   Post-op Pain Management:    Induction: Intravenous  PONV Risk Score and Plan: 2 and Ondansetron, Dexamethasone and Treatment may vary due to age or medical condition  Airway Management Planned: Oral ETT  Additional Equipment:   Intra-op Plan:   Post-operative Plan: Extubation in OR  Informed Consent: I have reviewed the patients History and Physical, chart, labs and discussed the procedure including the risks, benefits and alternatives for the proposed anesthesia with the patient or authorized representative who has indicated his/her understanding and acceptance.     Dental Advisory Given  Plan Discussed with: CRNA, Anesthesiologist and Surgeon  Anesthesia Plan Comments: (  )       Anesthesia Quick Evaluation

## 2018-06-08 NOTE — Op Note (Signed)
Preoperative diagnosis: Clinically localized adenocarcinoma of the prostate (clinical stage T1c N0 Mx)  Postoperative diagnosis: Clinically localized adenocarcinoma of the prostate (clinical stage T1c N0 M0)  Procedure:  1. Robotic assisted laparoscopic radical prostatectomy (bilateral nerve sparing) 2. Bilateral robotic assisted laparoscopic pelvic lymphadenectomy  Surgeon: Pryor Curia. M.D.  Assistant: Debbrah Alar, PA-C  An assistant was required for this surgical procedure.  The duties of the assistant included but were not limited to suctioning, passing suture, camera manipulation, retraction. This procedure would not be able to be performed without an Environmental consultant.  Resident: Dr. Lewie Loron  Anesthesia: General  Complications: None  EBL: 50 mL  IVF:  1300 mL crystalloid  Specimens: 1. Prostate and seminal vesicles 2. Right pelvic lymph nodes 3. Left pelvic lymph nodes  Disposition of specimens: Pathology  Drains: 1. 20 Fr coude catheter 2. # 19 Blake pelvic drain  Indication: Adam Mendoza is a 73 y.o. year old patient with clinically localized prostate cancer.  After a thorough review of the management options for treatment of prostate cancer, he elected to proceed with surgical therapy and the above procedure(s).  We have discussed the potential benefits and risks of the procedure, side effects of the proposed treatment, the likelihood of the patient achieving the goals of the procedure, and any potential problems that might occur during the procedure or recuperation. Informed consent has been obtained.  Description of procedure:  The patient was taken to the operating room and a general anesthetic was administered. He was given preoperative antibiotics, placed in the dorsal lithotomy position, and prepped and draped in the usual sterile fashion. Next a preoperative timeout was performed. A urethral catheter was placed into the bladder and a site was  selected near the umbilicus for placement of the camera port. This was placed using a standard open Hassan technique which allowed entry into the peritoneal cavity under direct vision and without difficulty. An 8 mm robotic port was placed and a pneumoperitoneum established. The camera was then used to inspect the abdomen and there was no evidence of any intra-abdominal injuries or other abnormalities. The remaining abdominal ports were then placed. 8 mm robotic ports were placed in the right lower quadrant, left lower quadrant, and far left lateral abdominal wall. A 5 mm port was placed in the right upper quadrant and a 12 mm port was placed in the right lateral abdominal wall for laparoscopic assistance. All ports were placed under direct vision without difficulty. The surgical cart was then docked.   Utilizing the cautery scissors, the bladder was reflected posteriorly allowing entry into the space of Retzius and identification of the endopelvic fascia and prostate. The periprostatic fat was then removed from the prostate allowing full exposure of the endopelvic fascia. The endopelvic fascia was then incised from the apex back to the base of the prostate bilaterally and the underlying levator muscle fibers were swept laterally off the prostate thereby isolating the dorsal venous complex. An accessory obturator artery was noted on the right side and was preserved. The dorsal vein was then stapled and divided with a 45 mm Flex Echelon stapler. Attention then turned to the bladder neck which was divided anteriorly thereby allowing entry into the bladder and exposure of the urethral catheter. The catheter balloon was deflated and the catheter was brought into the operative field and used to retract the prostate anteriorly. The posterior bladder neck was then examined and was divided allowing further dissection between the bladder and prostate posteriorly  until the vasa deferentia and seminal vessels were  identified. The vasa deferentia were isolated, divided, and lifted anteriorly. The seminal vesicles were dissected down to their tips with care to control the seminal vascular arterial blood supply. These structures were then lifted anteriorly and the space between Denonvillier's fascia and the anterior rectum was developed with a combination of sharp and blunt dissection. This isolated the vascular pedicles of the prostate.  The lateral prostatic fascia was then sharply incised allowing release of the neurovascular bundles bilaterally. The vascular pedicles of the prostate were then ligated with Weck clips between the prostate and neurovascular bundles and divided with sharp cold scissor dissection resulting in neurovascular bundle preservation. The neurovascular bundles were then separated off the apex of the prostate and urethra bilaterally.  The urethra was then sharply transected allowing the prostate specimen to be disarticulated. The pelvis was copiously irrigated and hemostasis was ensured. There was no evidence for rectal injury.  Attention then turned to the right pelvic sidewall. The fibrofatty tissue between the external iliac vein, confluence of the iliac vessels, hypogastric artery, and Cooper's ligament was dissected free from the pelvic sidewall with care to preserve the obturator nerve. Weck clips were used for lymphostasis and hemostasis. An identical procedure was performed on the contralateral side and the lymphatic packets were removed for permanent pathologic analysis.  Attention then turned to the urethral anastomosis. A 2-0 Vicryl slip knot was placed between Denonvillier's fascia, the posterior bladder neck, and the posterior urethra to reapproximate these structures. A double-armed 3-0 Monocryl suture was then used to perform a 360 running tension-free anastomosis between the bladder neck and urethra. A new urethral catheter was then placed into the bladder and irrigated. There  were no blood clots within the bladder and the anastomosis appeared to be watertight. A #19 Blake drain was then brought through the left lateral 8 mm port site and positioned appropriately within the pelvis. It was secured to the skin with a nylon suture. The surgical cart was then undocked. The right lateral 12 mm port site was closed at the fascial level with a 0 Vicryl suture placed laparoscopically. All remaining ports were then removed under direct vision. The prostate specimen was removed intact within the Endopouch retrieval bag via the periumbilical camera port site. This fascial opening was closed with two running 0 Vicryl sutures. 0.25% Marcaine was then injected into all port sites and all incisions were reapproximated at the skin level with 4-0 Monocryl subcuticular sutures and Dermabond. The patient appeared to tolerate the procedure well and without complications. The patient was able to be extubated and transferred to the recovery unit in satisfactory condition.   Pryor Curia MD

## 2018-06-08 NOTE — Discharge Instructions (Signed)

## 2018-06-09 ENCOUNTER — Encounter (HOSPITAL_COMMUNITY): Payer: Self-pay | Admitting: Urology

## 2018-06-09 ENCOUNTER — Encounter: Payer: Self-pay | Admitting: Medical Oncology

## 2018-06-09 DIAGNOSIS — C61 Malignant neoplasm of prostate: Secondary | ICD-10-CM | POA: Diagnosis not present

## 2018-06-09 LAB — HEMOGLOBIN AND HEMATOCRIT, BLOOD
HCT: 38 % — ABNORMAL LOW (ref 39.0–52.0)
HEMOGLOBIN: 12.6 g/dL — AB (ref 13.0–17.0)

## 2018-06-09 MED ORDER — TRAMADOL HCL 50 MG PO TABS: 50.0000 mg | ORAL_TABLET | Freq: Four times a day (QID) | ORAL | Status: DC | PRN

## 2018-06-09 MED ORDER — BISACODYL 10 MG RE SUPP
10.0000 mg | Freq: Once | RECTAL | Status: AC
  Administered 2018-06-09: 10 mg via RECTAL
  Filled 2018-06-09: qty 1

## 2018-06-09 NOTE — Progress Notes (Signed)
Patient ID: Adam Mendoza, male   DOB: October 02, 1945, 73 y.o.   MRN: [id number]  1 Day Post-Op Subjective: The patient is doing well.  No nausea or vomiting. Pain is adequately controlled.  Objective: Vital signs in last 24 hours: Temp:  [97.8 F (36.6 C)-99.7 F (37.6 C)] 99.5 F (37.5 C) (02/21 0628) Pulse Rate:  [63-77] 63 (02/21 0628) Resp:  [14-19] 19 (02/21 0628) BP: (122-149)/(74-86) 122/82 (02/21 0628) SpO2:  [96 %-100 %] 98 % (02/21 0628) Weight:  [75.5 kg] 75.5 kg (02/20 1604)  Intake/Output from previous day: 02/20 0701 - 02/21 0700 In: 4782.8 [P.O.:240; I.V.:3542.8; IV Piggyback:1000] Out: 8022 [Urine:3300; Drains:205; Blood:50] Intake/Output this shift: No intake/output data recorded.  Physical Exam:  General: Alert and oriented. CV: RRR Lungs: Clear bilaterally. GI: Soft, Nondistended. Incisions: Clean, dry, and intact Urine: Clear Extremities: Nontender, no erythema, no edema.  Lab Results: Recent Labs    06/08/18 1425 06/09/18 0601  HGB 13.8 12.6*  HCT 42.6 38.0*      Assessment/Plan: POD# 1 s/p robotic prostatectomy.  1) SL IVF 2) Ambulate, Incentive spirometry 3) Transition to oral pain medication 4) Dulcolax suppository 5) D/C pelvic drain 6) Plan for likely discharge later today   Adam Mendoza. MD   LOS: 0 days   Adam Mendoza 06/09/2018, 8:07 AM

## 2018-06-09 NOTE — Progress Notes (Signed)
Patient and his spouse given discharge, foley cath care, medication and follow up instructions, verbalized understanding w/return demo, IV removed, personal belongings with patient, family to transport home

## 2018-06-09 NOTE — Progress Notes (Signed)
Adam Mendoza is post op day #1 robotic prostatectomy. He states the surgery went well and he had a good night. He has been up walking and we discussed the importance of walking and deep breathing after discharge. He has follow up appointment with Dr. Alinda Money 2/26 for catheter removal and to discuss pathology report. I encouraged him to call with questions or concerns. He voiced understanding of the above. He voiced his appreciation for the great care he has received during this journey.

## 2018-06-09 NOTE — Discharge Summary (Signed)
[facility] Discharge Summary  Admit date: 06/08/2018  Discharge date and time: 06/09/18   Discharge to: Home  Discharge Service: Urology  Discharge Attending Physician:  Raynelle Bring, MD  Discharge  Diagnoses: <principal problem not specified>  Secondary Diagnosis: Active Problems:   Prostate cancer ([place])   OR Procedures: Procedure(s): XI ROBOTIC ASSISTED LAPAROSCOPIC RADICAL PROSTATECTOMY LEVEL 2 LYMPHADENECTOMY, PELVIC 06/08/2018   Ancillary Procedures: None   Discharge Day Services: The patient was seen and examined by the Urology team both in the morning and immediately prior to discharge.  Vital signs and laboratory values were stable and within normal limits.  The physical exam was benign and unchanged and all surgical wounds were examined.  Discharge instructions were explained and all questions answered.  Subjective  No acute events overnight. Pain Controlled. No fever or chills.  Objective Patient Vitals for the past 8 hrs:  BP Temp Temp src Pulse Resp SpO2  06/09/18 0628 122/82 99.5 F (37.5 C) Oral 63 19 98 %   Total I/O In: 120.5 [I.V.:120.5] Out: 0   General Appearance:        No acute distress Lungs:                       Normal work of breathing on room air Heart:                                Regular rate and rhythm Abdomen:                         Soft, non-tender, non-distended, incisions c/d/i Extremities:                      Warm and well perfused   Hospital Course:  The patient underwent RALP with b/l PLND on 06/08/2018.  The patient tolerated the procedure well, was extubated in the OR, and afterwards was taken to the PACU for routine post-surgical care. When stable the patient was transferred to the floor.   The patient did well postoperatively.  The patient's diet was slowly advanced and at the time of discharge was tolerating a regular diet.  The patient was discharged home 1 Day Post-Op, at which point was tolerating a regular solid  diet, was making adequate urine, having adequate pain control with P.O. pain medication, and could ambulate without difficulty. The patient will follow up with [place] for post op check.  Condition at Discharge: Improved  Discharge Medications:  Allergies as of 06/09/2018   No Known Allergies     Medication List    STOP taking these medications   GLUCOSAMINE CHONDROITIN COMPLX Caps   ibuprofen 200 MG tablet Commonly known as:  ADVIL,MOTRIN   ICAPS PO   Krill Oil 350 MG Caps   MULTIVITAMIN PO     TAKE these medications   fluticasone 50 MCG/ACT nasal spray Commonly known as:  FLONASE Place 1 spray into both nostrils daily.   simvastatin 20 MG tablet Commonly known as:  ZOCOR Take 20 mg by mouth daily.   sulfamethoxazole-trimethoprim 800-160 MG tablet Commonly known as:  BACTRIM DS,SEPTRA DS Take 1 tablet by mouth 2 (two) times daily. Start the day prior to foley removal appointment   traMADol 50 MG tablet Commonly known as:  ULTRAM Take 1-2 tablets (50-100 mg total) by mouth every 6 (six) hours as needed for moderate pain or  severe pain.

## 2021-03-23 ENCOUNTER — Other Ambulatory Visit (HOSPITAL_COMMUNITY): Payer: Self-pay | Admitting: Urology

## 2021-03-23 DIAGNOSIS — C61 Malignant neoplasm of prostate: Secondary | ICD-10-CM

## 2021-03-31 ENCOUNTER — Encounter (HOSPITAL_COMMUNITY)
Admission: RE | Admit: 2021-03-31 | Discharge: 2021-03-31 | Disposition: A | Discharge status: Home / Self Care | Payer: Medicare Other | Source: Ambulatory Visit | Attending: Urology | Admitting: Urology

## 2021-03-31 ENCOUNTER — Other Ambulatory Visit: Payer: Self-pay

## 2021-03-31 DIAGNOSIS — C61 Malignant neoplasm of prostate: Secondary | ICD-10-CM | POA: Diagnosis present

## 2021-03-31 MED ORDER — PIFLIFOLASTAT F 18 (PYLARIFY) INJECTION
9.0000 | Freq: Once | INTRAVENOUS | Status: AC
  Administered 2021-03-31: 8.5 via INTRAVENOUS

## 2022-12-07 ENCOUNTER — Other Ambulatory Visit (HOSPITAL_COMMUNITY): Payer: Self-pay | Admitting: Urology

## 2022-12-07 DIAGNOSIS — C61 Malignant neoplasm of prostate: Secondary | ICD-10-CM

## 2022-12-21 ENCOUNTER — Encounter (HOSPITAL_COMMUNITY)
Admission: RE | Admit: 2022-12-21 | Discharge: 2022-12-21 | Disposition: A | Discharge status: Home / Self Care | Payer: Medicare Other | Source: Ambulatory Visit | Attending: Urology | Admitting: Urology

## 2022-12-21 DIAGNOSIS — C61 Malignant neoplasm of prostate: Secondary | ICD-10-CM | POA: Insufficient documentation

## 2022-12-21 MED ORDER — FLOTUFOLASTAT F 18 GALLIUM 296-5846 MBQ/ML IV SOLN
8.3550 | Freq: Once | INTRAVENOUS | Status: AC
  Administered 2022-12-21: 8.355 via INTRAVENOUS

## 2023-08-24 IMAGING — CT NM PET TUM IMG SKULL BASE T - THIGH
1 of 7 series · 1 of 25 positions shown · non-contrast
Comparison: MRI pelvis 03 07 18

CLINICAL DATA: Prostate carcinoma with biochemical recurrence. PSA
equal

EXAM:
NUCLEAR MEDICINE PET SKULL BASE TO THIGH
TECHNIQUE: 8.5 mCi F18 Piflufolastat (Pylarify) was injected intravenously.
Full-ring PET imaging was performed from the skull base to thigh
after the radiotracer. CT data was obtained and used for attenuation
correction and anatomic localization.

[Series 3: pet sk_thigh ac · axial · 5.0mm · 4.07mm/px · 1 of 269 slices shown]
[im 161/269]
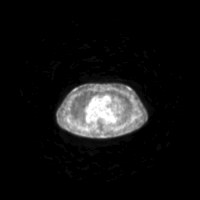

[1 of 25 positions shown; findings below may reference images not displayed]

FINDINGS: NECK

No radiotracer activity in neck lymph nodes.

Incidental CT finding: None

CHEST

No radiotracer accumulation within mediastinal or hilar lymph nodes.
No suspicious pulmonary nodules on the CT scan.

Incidental CT finding: None

ABDOMEN/PELVIS

Prostate: No focal activity in the prostate bed.

Lymph nodes: No abnormal radiotracer accumulation within pelvic or
abdominal nodes.

Liver: No evidence of liver metastasis

Incidental CT finding: Atherosclerotic calcification of the aorta.

SKELETON

No focal activity to suggest skeletal metastasis. No suspicious
sclerotic lesions on the CT portion exam
IMPRESSION: 1. No evidence prostate cancer recurrence in the prostatectomy bed.
2. No evidence of metastatic prostate cancer adenopathy.
3. No evidence of visceral metastasis or skeletal metastasis.
